# Patient Record
Sex: Male | Born: 1990 | Race: Black or African American | Hispanic: No | Marital: Single | Smoking: Never smoker
Health system: Southern US, Community
[De-identification: ages and names within clinical notes are randomized; demographics above are authoritative.]

---
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
## 2011-10-22 ENCOUNTER — Encounter (HOSPITAL_COMMUNITY): Payer: Self-pay | Admitting: *Deleted

## 2011-10-22 ENCOUNTER — Emergency Department (HOSPITAL_COMMUNITY)
Admission: EM | Admit: 2011-10-22 | Discharge: 2011-10-22 | Disposition: A | Discharge status: Home / Self Care | Payer: Managed Care, Other (non HMO) | Source: Home / Self Care | Attending: Emergency Medicine | Admitting: Emergency Medicine

## 2011-10-22 DIAGNOSIS — R079 Chest pain, unspecified: Secondary | ICD-10-CM

## 2011-10-22 DIAGNOSIS — R0602 Shortness of breath: Secondary | ICD-10-CM

## 2011-10-22 MED ORDER — ALBUTEROL SULFATE HFA 108 (90 BASE) MCG/ACT IN AERS: 1.0000 | INHALATION_SPRAY | Freq: Four times a day (QID) | RESPIRATORY_TRACT | Status: DC | PRN

## 2011-10-22 NOTE — ED Provider Notes (Addendum)
History     CSN: [id number]  Arrival date & time 10/22/11  1324   First MD Initiated Contact with Patient 10/22/11 1424      Chief Complaint  Patient presents with  . Chest Pain    (Consider location/radiation/quality/duration/timing/severity/associated sxs/prior treatment) HPI Comments: Patient describes a for several months he notices it when he performs certain physical activities or participating in athletics he gets short of breath and experiences a midsternal chest pain that feels like pressure. Patient denies any recent respiratory symptoms such as cough, nasal congestion or fevers. Denies any constitutional symptoms at this point such as fevers generalized malaise, arthralgias myalgias or changes in appetite or unintentional weight loss. Patient denies expressing any shortness of breath, chest pains or palpitations at this moment. He describes it in the past it was discovered that he had a murmur and he underwent consultation with a cardiologist a couple years ago where no abnormalities were found.  Patient is a 21 y.o. male presenting with chest pain. The history is provided by the patient.  Chest Pain The chest pain began more than 2 weeks ago. Chest pain occurs constantly. The chest pain is unchanged. At its most intense, the pain is at 5/10. The quality of the pain is described as dull. Chest pain is worsened by certain positions. Primary symptoms include shortness of breath. Pertinent negatives for primary symptoms include no fever, no cough, no wheezing, no palpitations, no abdominal pain, no nausea, no vomiting, no dizziness and no altered mental status.  Pertinent negatives for associated symptoms include no diaphoresis, no numbness and no weakness.     History reviewed. No pertinent past medical history.  History reviewed. No pertinent past surgical history.  History reviewed. No pertinent family history.  History  Substance Use Topics  . Smoking status: Not on file    . Smokeless tobacco: Not on file  . Alcohol Use: Not on file      Review of Systems  Constitutional: Negative for fever, diaphoresis and activity change.  Respiratory: Positive for shortness of breath. Negative for cough and wheezing.   Cardiovascular: Positive for chest pain. Negative for palpitations and leg swelling.  Gastrointestinal: Negative for nausea, vomiting and abdominal pain.  Neurological: Negative for dizziness, tremors, syncope, weakness, numbness and headaches.  Psychiatric/Behavioral: Negative for altered mental status.    Allergies  Review of patient's allergies indicates not on file.  Home Medications   Current Outpatient Rx  Name Route Sig Dispense Refill  . ALBUTEROL SULFATE HFA 108 (90 BASE) MCG/ACT IN AERS Inhalation Inhale 1-2 puffs into the lungs every 6 (six) hours as needed for wheezing. 1 Inhaler 0    BP 126/67  Pulse 84  Temp 98.5 F (36.9 C) (Oral)  Resp 16  SpO2 100%  Physical Exam  Nursing note and vitals reviewed. Constitutional: Vital signs are normal. He appears well-developed and well-nourished.  Non-toxic appearance. He does not have a sickly appearance. He does not appear ill. No distress.  HENT:  Head: Normocephalic.  Eyes: Conjunctivae normal are normal. No scleral icterus.  Neck: Neck supple. No JVD present. No tracheal deviation present. No thyromegaly present.  Cardiovascular: Regular rhythm, normal heart sounds and normal pulses.  Exam reveals no gallop and no friction rub.   No murmur heard. Pulmonary/Chest: Effort normal and breath sounds normal. No respiratory distress. He has no decreased breath sounds. He has no wheezes. He has no rhonchi. He has no rales. He exhibits no tenderness.  Abdominal: Soft. There is  no tenderness. There is no rebound and no guarding.  Neurological: He is alert.  Skin: Skin is warm.    ED Course  Procedures (including critical care time)  Labs Reviewed - No data to display No results  found.   1. Shortness of breath   2. Chest pain    rhythm strip performed with handheld device for 60 seconds. Reveal no cardiac arrhythmias. Normal sinus rhythm with a ventricular rate of 56-60 per minute. Informational bedside echo done were a normal long axis parasternal view was performed with no obvious abnormalities such as mitral valve dysfunction or ventricular wall hypokinesis.   MDM   Exertional dyspnea with chest pains. Patient was asymptomatic during exam had a normal lung cardiovascular exam. Historically the patient has been seen by a cardiologist in the past where no abnormalities were found as patient reports. Patient was encouraged to try albuterol, if shortness of breath with exertional activities as a trial. Patient agrees treatment plan and follow-up care further chest pains associated with dizziness, shortness of breath he should go to the emergency department for further evaluationas necessary.     Jimmie Molly, MD 10/22/11 1610  Jimmie Molly, MD 10/22/11 [phone]

## 2011-10-22 NOTE — ED Notes (Signed)
Pt  Reports     midsternal  Chest  Pain   Worse  When  He  Takes  A  Deep  Breath         He  Also  Reports     He  Gets  The  Symptoms    After  Participating  In  Athletics        At this  Time  He  Is  Pain  Free   siting on  Exam  Table   Speaking in  Complete  sentances  Skin  Is       Warm  And  Dry

## 2012-04-19 ENCOUNTER — Emergency Department (HOSPITAL_COMMUNITY)
Admission: EM | Admit: 2012-04-19 | Discharge: 2012-04-19 | Disposition: A | Discharge status: Home / Self Care | Payer: 59 | Attending: Emergency Medicine | Admitting: Emergency Medicine

## 2012-04-19 ENCOUNTER — Emergency Department (HOSPITAL_COMMUNITY): Payer: 59

## 2012-04-19 DIAGNOSIS — R0789 Other chest pain: Secondary | ICD-10-CM | POA: Insufficient documentation

## 2012-04-19 LAB — CBC
Hemoglobin: 14.4 g/dL (ref 13.0–17.0)
MCH: 30.4 pg (ref 26.0–34.0)
MCHC: 36.6 g/dL — ABNORMAL HIGH (ref 30.0–36.0)
Platelets: 226 10*3/uL (ref 150–400)
RBC: 4.74 MIL/uL (ref 4.22–5.81)

## 2012-04-19 LAB — BASIC METABOLIC PANEL
Calcium: 9.9 mg/dL (ref 8.4–10.5)
GFR calc non Af Amer: >90 mL/min (ref >90)
Potassium: 3.8 mEq/L (ref 3.5–5.1)
Sodium: 142 mEq/L (ref 135–145)

## 2012-04-19 MED ORDER — IBUPROFEN 200 MG PO TABS
400.0000 mg | ORAL_TABLET | Freq: Once | ORAL | Status: AC
  Administered 2012-04-19: 400 mg via ORAL
  Filled 2012-04-19: qty 2

## 2012-04-19 NOTE — ED Provider Notes (Signed)
Complains of constant chest pain for 2 days, pleuritic worse with lying supine and improved with standing up no fever and no shortness of breath no other complaints treated himself with albuterol, without relief. On exam patient is in no distress lungs clear auscultation heart regular rate and rhythm no murmurs or rubs pain is improved since treatment with ibuprofen in the emergency department is presently mild. Plan ibuprofen cardiology followup as needed if not improved in a few days. There is no EKG evidence of pericarditis  Doug Sou, MD 04/19/12 [phone]

## 2012-04-19 NOTE — ED Notes (Signed)
Pt states non radiating substernal Chest pressure x 2 days. Denies N/V, diaphoresis, SOB . Pain exacerbated by laying down, Pain relived by standing up. Activity at onset pt was sitting.

## 2012-04-19 NOTE — ED Provider Notes (Signed)
History     CSN: [id number]  Arrival date & time 04/19/12  6213   First MD Initiated Contact with Patient 04/19/12 [phone]      Chief Complaint  Patient presents with  . Chest Pain    (Consider location/radiation/quality/duration/timing/severity/associated sxs/prior treatment) HPI  Edward Wilcox is a 22 y.o. male complaining of substernal chest pressure intermittently for 2 days pain at its worst is 7/10 it is 7/10 right now. Standing alleviates the symptoms, lying down supine exacerbates. Patient took an albuterol inhalation to ease the pain with no relief. No family history of early cardiac death. Patient denies fever, chest pain, abdominal pain, shortness of breath, back pain, recent immobilization, leg swelling, viral syndrome in the last 2 months, unusual exertion, exercise for trauma.    No past medical history on file.  No past surgical history on file.  No family history on file.  History  Substance Use Topics  . Smoking status: Not on file  . Smokeless tobacco: Not on file  . Alcohol Use: Not on file      Review of Systems  Constitutional: Negative for fever.  Respiratory: Negative for cough and shortness of breath.   Cardiovascular: Positive for chest pain.  Gastrointestinal: Negative for nausea, vomiting, abdominal pain and diarrhea.  All other systems reviewed and are negative.    Allergies  Review of patient's allergies indicates no known allergies.  Home Medications   Current Outpatient Rx  Name  Route  Sig  Dispense  Refill  . albuterol (PROVENTIL HFA;VENTOLIN HFA) 108 (90 BASE) MCG/ACT inhaler   Inhalation   Inhale 1-2 puffs into the lungs every 6 (six) hours as needed for wheezing.   1 Inhaler   0     BP 141/76  Pulse 68  Temp(Src) 98 F (36.7 C)  Resp 21  SpO2 98%  Physical Exam  Nursing note and vitals reviewed. Constitutional: He is oriented to person, place, and time. He appears well-developed and well-nourished. No distress.   HENT:  Head: Normocephalic.  Mouth/Throat: Oropharynx is clear and moist.  Eyes: Conjunctivae and EOM are normal. Pupils are equal, round, and reactive to light.  Neck: Normal range of motion. No JVD present.  Cardiovascular: Normal rate, regular rhythm and intact distal pulses.   Pulmonary/Chest: Effort normal and breath sounds normal. No stridor. No respiratory distress. He has no wheezes. He has no rales. He exhibits no tenderness.  nonreproducible chest pain  Abdominal: Soft. Bowel sounds are normal. He exhibits no distension and no mass. There is no tenderness. There is no rebound and no guarding.  Musculoskeletal: Normal range of motion. He exhibits no edema.  No calf asymmetry, edema, erythema, superficial collaterals, palpable cords, Homans negative bilaterally.  Neurological: He is alert and oriented to person, place, and time.  Psychiatric: He has a normal mood and affect.    ED Course  Procedures (including critical care time)  Labs Reviewed  CBC - Abnormal; Notable for the following:    MCHC 36.6 (*)    All other components within normal limits  BASIC METABOLIC PANEL   Dg Chest Port 1 View  04/19/2012  *RADIOLOGY REPORT*  Clinical Data: Chest pain  PORTABLE CHEST - 1 VIEW  Comparison: None.  Findings: Borderline heart size with normal pulmonary vascularity. No focal consolidation or airspace disease in the lungs.  No blunting of costophrenic angles.  No pneumothorax.  Mediastinal contours appear intact.  Old healed fracture deformity of the right clavicle.  IMPRESSION: No evidence of  active pulmonary disease.   Original Report Authenticated By: Burman Nieves, M.D.      Date: 04/19/2012  Rate: 65 Rhythm: normal sinus rhythm  QRS Axis: normal  Intervals: PR prolonged  ST/T Wave abnormalities: normal  Conduction Disutrbances:first-degree A-V block   Narrative Interpretation:   Old EKG Reviewed: none available    1. Atypical chest pain       MDM   [name] is a 22 y.o. male With 2 days of intermittent non-reproducible pressure-like chest pain that is alleviated by standing up. EKG is nonischemic, no signs of pericarditis, QT prolongation.   She is improved after Motrin.  Filed Vitals:   04/19/12 0645 04/19/12 0700 04/19/12 0715 04/19/12 0730  BP: 138/81 133/83 129/84 130/80  Pulse: 68 62 61 57  Temp:      Resp: 19 17 17 12   SpO2: 98% 100% 100% 100%    This is a shared visit with the attending physician who personally evaluated the patient and agrees with the care plan.   Pt verbalized understanding and agrees with care plan. Outpatient follow-up and return precautions given.             Wynetta Emery, PA-C 04/19/12 [phone]

## 2012-04-19 NOTE — ED Provider Notes (Signed)
Medical screening examination/treatment/procedure(s) were conducted as a shared visit with non-physician practitioner(s) and myself.  I personally evaluated the patient during the encounter  Doug Sou, MD 04/19/12 1130

## 2014-03-01 ENCOUNTER — Encounter (HOSPITAL_COMMUNITY): Payer: Self-pay | Admitting: Family Medicine

## 2014-03-01 ENCOUNTER — Emergency Department (INDEPENDENT_AMBULATORY_CARE_PROVIDER_SITE_OTHER): Payer: Managed Care, Other (non HMO)

## 2014-03-01 ENCOUNTER — Emergency Department (INDEPENDENT_AMBULATORY_CARE_PROVIDER_SITE_OTHER)
Admission: EM | Admit: 2014-03-01 | Discharge: 2014-03-01 | Disposition: A | Discharge status: Home / Self Care | Payer: Managed Care, Other (non HMO) | Source: Home / Self Care | Attending: Family Medicine | Admitting: Family Medicine

## 2014-03-01 DIAGNOSIS — S93402A Sprain of unspecified ligament of left ankle, initial encounter: Secondary | ICD-10-CM

## 2014-03-01 DIAGNOSIS — IMO0001 Reserved for inherently not codable concepts without codable children: Secondary | ICD-10-CM

## 2014-03-01 NOTE — ED Notes (Signed)
States he was playing basketball yesterday, and landed on another players foot . Pain and swelling left lateral ankle

## 2014-03-01 NOTE — ED Provider Notes (Addendum)
CSN: [id number]     Arrival date & time 03/01/14  1120 History   None    Chief Complaint  Patient presents with  . Ankle Injury   (Consider location/radiation/quality/duration/timing/severity/associated sxs/prior Treatment) HPI  L ankle: playing basketball yesterday anf pts foot came down on someone elses foot. Rolled foot under. Felt a pop. Stopped playing. Immediately painful. Immediately started to swell. Elevation and ice w/ some benefit. Sensation and movement intact. Denies syncope, loc, dizziness.    History reviewed. No pertinent past medical history. History reviewed. No pertinent past surgical history. Family History  Problem Relation Age of Onset  . Cancer Neg Hx   . Heart failure Neg Hx   . Hyperlipidemia Neg Hx   . Diabetes Neg Hx   . Hypertension Neg Hx    History  Substance Use Topics  . Smoking status: Never Smoker   . Smokeless tobacco: Not on file  . Alcohol Use: No    Review of Systems Per HPI with all other pertinent systems negative.   Allergies  Review of patient's allergies indicates no known allergies.  Home Medications   Prior to Admission medications   Not on File   BP 126/79 mmHg  Pulse 69  Temp(Src) 99.5 F (37.5 C) (Oral)  Resp 16  SpO2 100% Physical Exam  Constitutional: He is oriented to person, place, and time. He appears well-developed and well-nourished.  HENT:  Head: Normocephalic and atraumatic.  Eyes: EOM are normal. Pupils are equal, round, and reactive to light.  Neck: Normal range of motion.  Cardiovascular: Normal rate, normal heart sounds and intact distal pulses.   Pulmonary/Chest: Effort normal and breath sounds normal.  Abdominal: He exhibits no distension.  Musculoskeletal:  Left ankle full range of motion. Tenderness to palpation near the insertion of the ATFL. Significant swelling of the ankle laterally. No ankle instability  Neurological: He is alert and oriented to person, place, and time.  Skin: Skin is  warm.  Psychiatric: He has a normal mood and affect. His behavior is normal. Thought content normal.  ED Course  Procedures (including critical care time) Labs Review Labs Reviewed - No data to display  Imaging Review No results found.   MDM   1. Grade 2 ankle sprain, left, initial encounter     Grade 2 left ankle sprain. Rest ice, anti-inflammatories Tried ASO but pt preferrs Cam walker and crutches for greater support.  Encouraged early ambulation and ROM exercises Patient to let pain be the guide. Patient to return if no improvement in 2 weeks for further imaging.  Precautions given and all questions answered  Shelly Flattenavid Ademide Schaberg, MD Family Medicine 03/01/2014, 12:41 PM     Ozella Rocksavid J Bhavika Schnider, MD 03/01/14 1241  Ozella Rocksavid J Aurie Harroun, MD 03/01/14 1256

## 2014-03-01 NOTE — Discharge Instructions (Signed)
You have developed a grade 2 ankle sprain. There is no evidence of fracture. Please use the ankle brace as needed for support. His let pain be her guide return to activity as able. Please elevate and ice her foot for the next 24-48 hours. Please use ibuprofen 600 mg every 6 hours for pain and inflammation. Please contact if your pain is not any better in 2 weeks for further x-rays.   Acute Ankle Sprain with Phase I Rehab An acute ankle sprain is a partial or complete tear in one or more of the ligaments of the ankle due to traumatic injury. The severity of the injury depends on both the number of ligaments sprained and the grade of sprain. There are 3 grades of sprains.   A grade 1 sprain is a mild sprain. There is a slight pull without obvious tearing. There is no loss of strength, and the muscle and ligament are the correct length.  A grade 2 sprain is a moderate sprain. There is tearing of fibers within the substance of the ligament where it connects two bones or two cartilages. The length of the ligament is increased, and there is usually decreased strength.  A grade 3 sprain is a complete rupture of the ligament and is uncommon. In addition to the grade of sprain, there are three types of ankle sprains.  Lateral ankle sprains: This is a sprain of one or more of the three ligaments on the outer side (lateral) of the ankle. These are the most common sprains. Medial ankle sprains: There is one large triangular ligament of the inner side (medial) of the ankle that is susceptible to injury. Medial ankle sprains are less common. Syndesmosis, "high ankle," sprains: The syndesmosis is the ligament that connects the two bones of the lower leg. Syndesmosis sprains usually only occur with very severe ankle sprains. SYMPTOMS  Pain, tenderness, and swelling in the ankle, starting at the side of injury that may progress to the whole ankle and foot with time.  "Pop" or tearing sensation at the time of  injury.  Bruising that may spread to the heel.  Impaired ability to walk soon after injury. CAUSES   Acute ankle sprains are caused by trauma placed on the ankle that temporarily forces or pries the anklebone (talus) out of its normal socket.  Stretching or tearing of the ligaments that normally hold the joint in place (usually due to a twisting injury). RISK INCREASES WITH:  Previous ankle sprain.  Sports in which the foot may land awkwardly (i.e., basketball, volleyball, or soccer) or walking or running on uneven or rough surfaces.  Shoes with inadequate support to prevent sideways motion when stress occurs.  Poor strength and flexibility.  Poor balance skills.  Contact sports. PREVENTION   Warm up and stretch properly before activity.  Maintain physical fitness:  Ankle and leg flexibility, muscle strength, and endurance.  Cardiovascular fitness.  Balance training activities.  Use proper technique and have a coach correct improper technique.  Taping, protective strapping, bracing, or high-top tennis shoes may help prevent injury. Initially, tape is best; however, it loses most of its support function within 10 to 15 minutes.  Wear proper-fitted protective shoes (High-top shoes with taping or bracing is more effective than either alone).  Provide the ankle with support during sports and practice activities for 12 months following injury. PROGNOSIS   If treated properly, ankle sprains can be expected to recover completely; however, the length of recovery depends on the degree of  injury.  A grade 1 sprain usually heals enough in 5 to 7 days to allow modified activity and requires an average of 6 weeks to heal completely.  A grade 2 sprain requires 6 to 10 weeks to heal completely.  A grade 3 sprain requires 12 to 16 weeks to heal.  A syndesmosis sprain often takes more than 3 months to heal. RELATED COMPLICATIONS   Frequent recurrence of symptoms may result in a  chronic problem. Appropriately addressing the problem the first time decreases the frequency of recurrence and optimizes healing time. Severity of the initial sprain does not predict the likelihood of later instability.  Injury to other structures (bone, cartilage, or tendon).  A chronically unstable or arthritic ankle joint is a possibility with repeated sprains. TREATMENT Treatment initially involves the use of ice, medication, and compression bandages to help reduce pain and inflammation. Ankle sprains are usually immobilized in a walking cast or boot to allow for healing. Crutches may be recommended to reduce pressure on the injury. After immobilization, strengthening and stretching exercises may be necessary to regain strength and a full range of motion. Surgery is rarely needed to treat ankle sprains. MEDICATION   Nonsteroidal anti-inflammatory medications, such as aspirin and ibuprofen (do not take for the first 3 days after injury or within 7 days before surgery), or other minor pain relievers, such as acetaminophen, are often recommended. Take these as directed by your caregiver. Contact your caregiver immediately if any bleeding, stomach upset, or signs of an allergic reaction occur from these medications.  Ointments applied to the skin may be helpful.  Pain relievers may be prescribed as necessary by your caregiver. Do not take prescription pain medication for longer than 4 to 7 days. Use only as directed and only as much as you need. HEAT AND COLD  Cold treatment (icing) is used to relieve pain and reduce inflammation for acute and chronic cases. Cold should be applied for 10 to 15 minutes every 2 to 3 hours for inflammation and pain and immediately after any activity that aggravates your symptoms. Use ice packs or an ice massage.  Heat treatment may be used before performing stretching and strengthening activities prescribed by your caregiver. Use a heat pack or a warm soak. SEEK  IMMEDIATE MEDICAL CARE IF:   Pain, swelling, or bruising worsens despite treatment.  You experience pain, numbness, discoloration, or coldness in the foot or toes.  New, unexplained symptoms develop (drugs used in treatment may produce side effects.) EXERCISES  PHASE I EXERCISES RANGE OF MOTION (ROM) AND STRETCHING EXERCISES - Ankle Sprain, Acute Phase I, Weeks 1 to 2 These exercises may help you when beginning to restore flexibility in your ankle. You will likely work on these exercises for the 1 to 2 weeks after your injury. Once your physician, physical therapist, or athletic trainer sees adequate progress, he or she will advance your exercises. While completing these exercises, remember:   Restoring tissue flexibility helps normal motion to return to the joints. This allows healthier, less painful movement and activity.  An effective stretch should be held for at least 30 seconds.  A stretch should never be painful. You should only feel a gentle lengthening or release in the stretched tissue. RANGE OF MOTION - Dorsi/Plantar Flexion  While sitting with your right / left knee straight, draw the top of your foot upwards by flexing your ankle. Then reverse the motion, pointing your toes downward.  Hold each position for __________ seconds.  After completing  your first set of exercises, repeat this exercise with your knee bent. Repeat __________ times. Complete this exercise __________ times per day.  RANGE OF MOTION - Ankle Alphabet  Imagine your right / left big toe is a pen.  Keeping your hip and knee still, write out the entire alphabet with your "pen." Make the letters as large as you can without increasing any discomfort. Repeat __________ times. Complete this exercise __________ times per day.  STRENGTHENING EXERCISES - Ankle Sprain, Acute -Phase I, Weeks 1 to 2 These exercises may help you when beginning to restore strength in your ankle. You will likely work on these exercises  for 1 to 2 weeks after your injury. Once your physician, physical therapist, or athletic trainer sees adequate progress, he or she will advance your exercises. While completing these exercises, remember:   Muscles can gain both the endurance and the strength needed for everyday activities through controlled exercises.  Complete these exercises as instructed by your physician, physical therapist, or athletic trainer. Progress the resistance and repetitions only as guided.  You may experience muscle soreness or fatigue, but the pain or discomfort you are trying to eliminate should never worsen during these exercises. If this pain does worsen, stop and make certain you are following the directions exactly. If the pain is still present after adjustments, discontinue the exercise until you can discuss the trouble with your clinician. STRENGTH - Dorsiflexors  Secure a rubber exercise band/tubing to a fixed object (i.e., table, pole) and loop the other end around your right / left foot.  Sit on the floor facing the fixed object. The band/tubing should be slightly tense when your foot is relaxed.  Slowly draw your foot back toward you using your ankle and toes.  Hold this position for __________ seconds. Slowly release the tension in the band and return your foot to the starting position. Repeat __________ times. Complete this exercise __________ times per day.  STRENGTH - Plantar-flexors   Sit with your right / left leg extended. Holding onto both ends of a rubber exercise band/tubing, loop it around the ball of your foot. Keep a slight tension in the band.  Slowly push your toes away from you, pointing them downward.  Hold this position for __________ seconds. Return slowly, controlling the tension in the band/tubing. Repeat __________ times. Complete this exercise __________ times per day.  STRENGTH - Ankle Eversion  Secure one end of a rubber exercise band/tubing to a fixed object (table,  pole). Loop the other end around your foot just before your toes.  Place your fists between your knees. This will focus your strengthening at your ankle.  Drawing the band/tubing across your opposite foot, slowly, pull your little toe out and up. Make sure the band/tubing is positioned to resist the entire motion.  Hold this position for __________ seconds. Have your muscles resist the band/tubing as it slowly pulls your foot back to the starting position.  Repeat __________ times. Complete this exercise __________ times per day.  STRENGTH - Ankle Inversion  Secure one end of a rubber exercise band/tubing to a fixed object (table, pole). Loop the other end around your foot just before your toes.  Place your fists between your knees. This will focus your strengthening at your ankle.  Slowly, pull your big toe up and in, making sure the band/tubing is positioned to resist the entire motion.  Hold this position for __________ seconds.  Have your muscles resist the band/tubing as it slowly pulls  your foot back to the starting position. Repeat __________ times. Complete this exercises __________ times per day.  STRENGTH - Towel Curls  Sit in a chair positioned on a non-carpeted surface.  Place your right / left foot on a towel, keeping your heel on the floor.  Pull the towel toward your heel by only curling your toes. Keep your heel on the floor.  If instructed by your physician, physical therapist, or athletic trainer, add weight to the end of the towel. Repeat __________ times. Complete this exercise __________ times per day. Document Released: 08/07/2004 Document Revised: 05/23/2013 Document Reviewed: 04/20/2008 [facility] Patient Information 2015 [place], [place]. This information is not intended to replace advice given to you by your health care provider. Make sure you discuss any questions you have with your health care provider.

## 2014-03-06 ENCOUNTER — Telehealth (HOSPITAL_COMMUNITY): Payer: Self-pay | Admitting: *Deleted

## 2014-03-11 ENCOUNTER — Telehealth (HOSPITAL_COMMUNITY): Payer: Self-pay | Admitting: *Deleted

## 2014-03-11 NOTE — ED Notes (Signed)
Spoke with Dr. Konrad DoloresMerrell.  Phone call was to notify pt that final radiology report stated small avulsion fx.  Per Dr. Konrad DoloresMerrell: pt just needs to f/u with orthopedist of pt's choice in next 1-2 wks; cam walker pt received is sufficient.  Pt notified of info - verbalized understanding.

## 2014-03-14 NOTE — ED Notes (Signed)
I was calling pt. back and left him a message,  but I see a note now that he called back on 2/20 and was given the information. Edward Wilcox, Edward Wilcox 03/14/2014

## 2016-08-15 IMAGING — DX DG ANKLE COMPLETE 3+V*L*
3 series · 3 of 3 positions shown · non-contrast
Comparison: None.

CLINICAL DATA: Fall yesterday with lateral malleolus pain on the
left. Initial encounter

EXAM:
LEFT ANKLE COMPLETE - 3+ VIEW

[ankle ap]
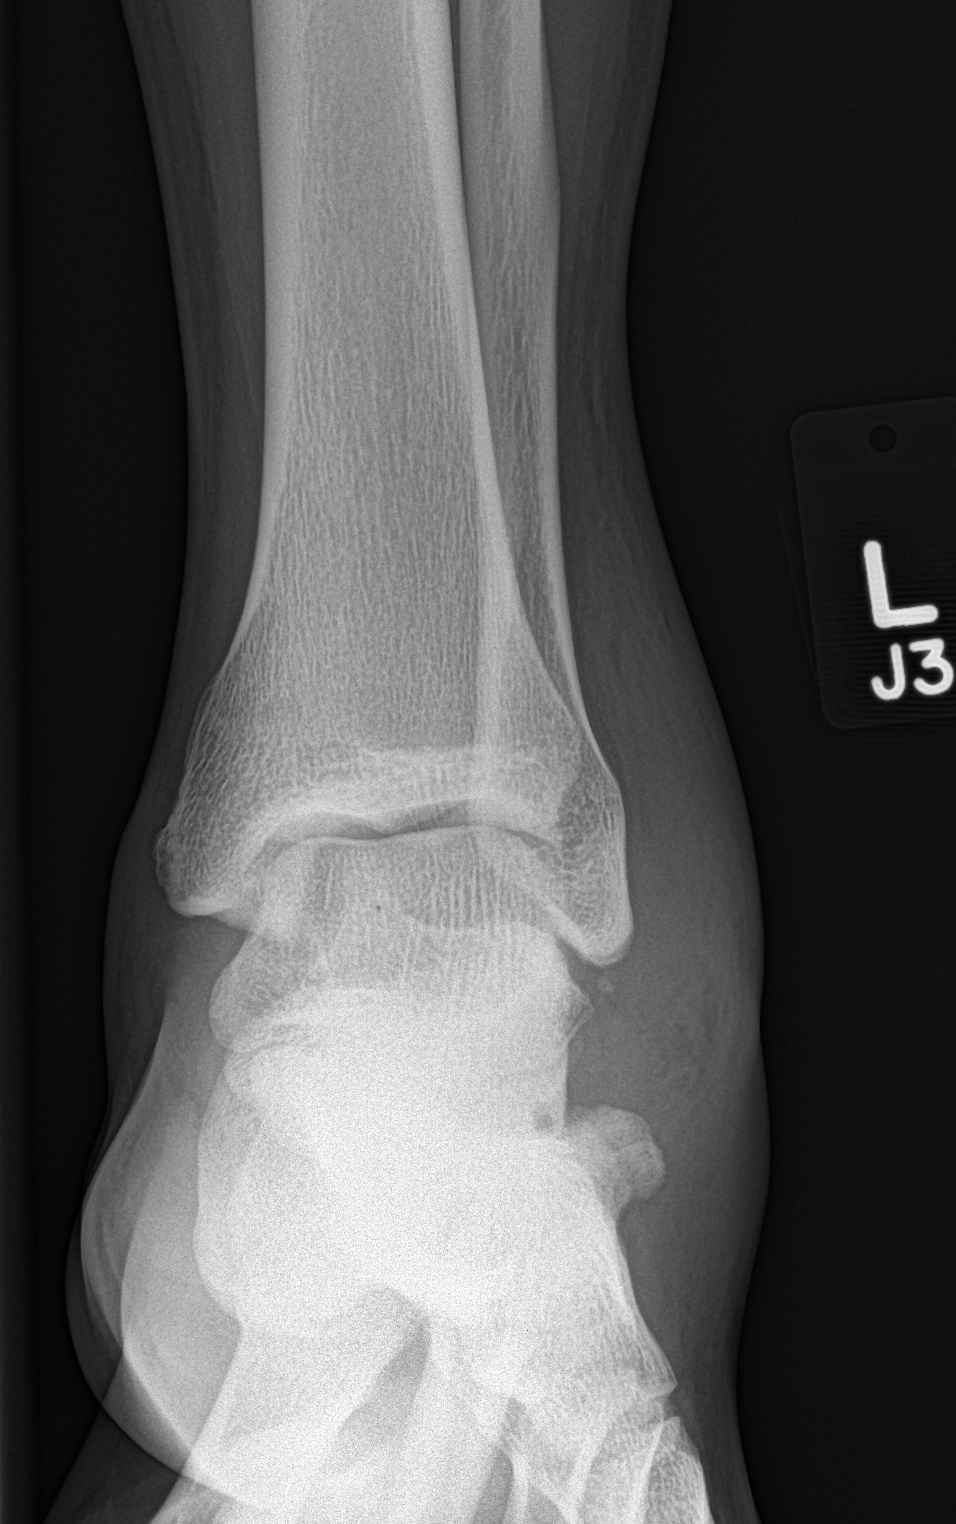

[ankle obl]
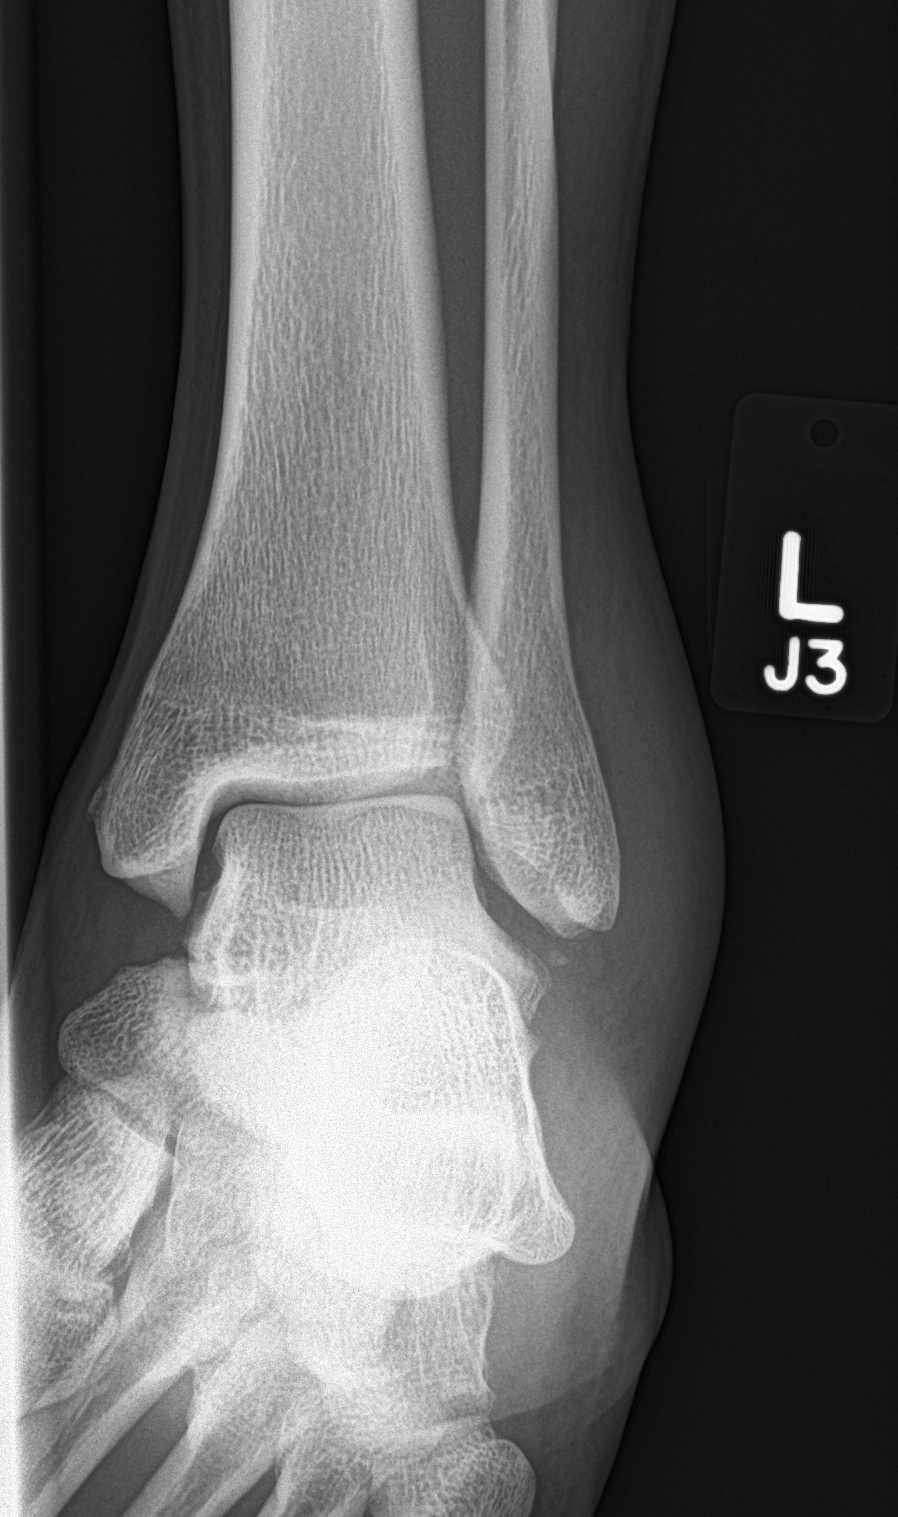

[ankle lat]
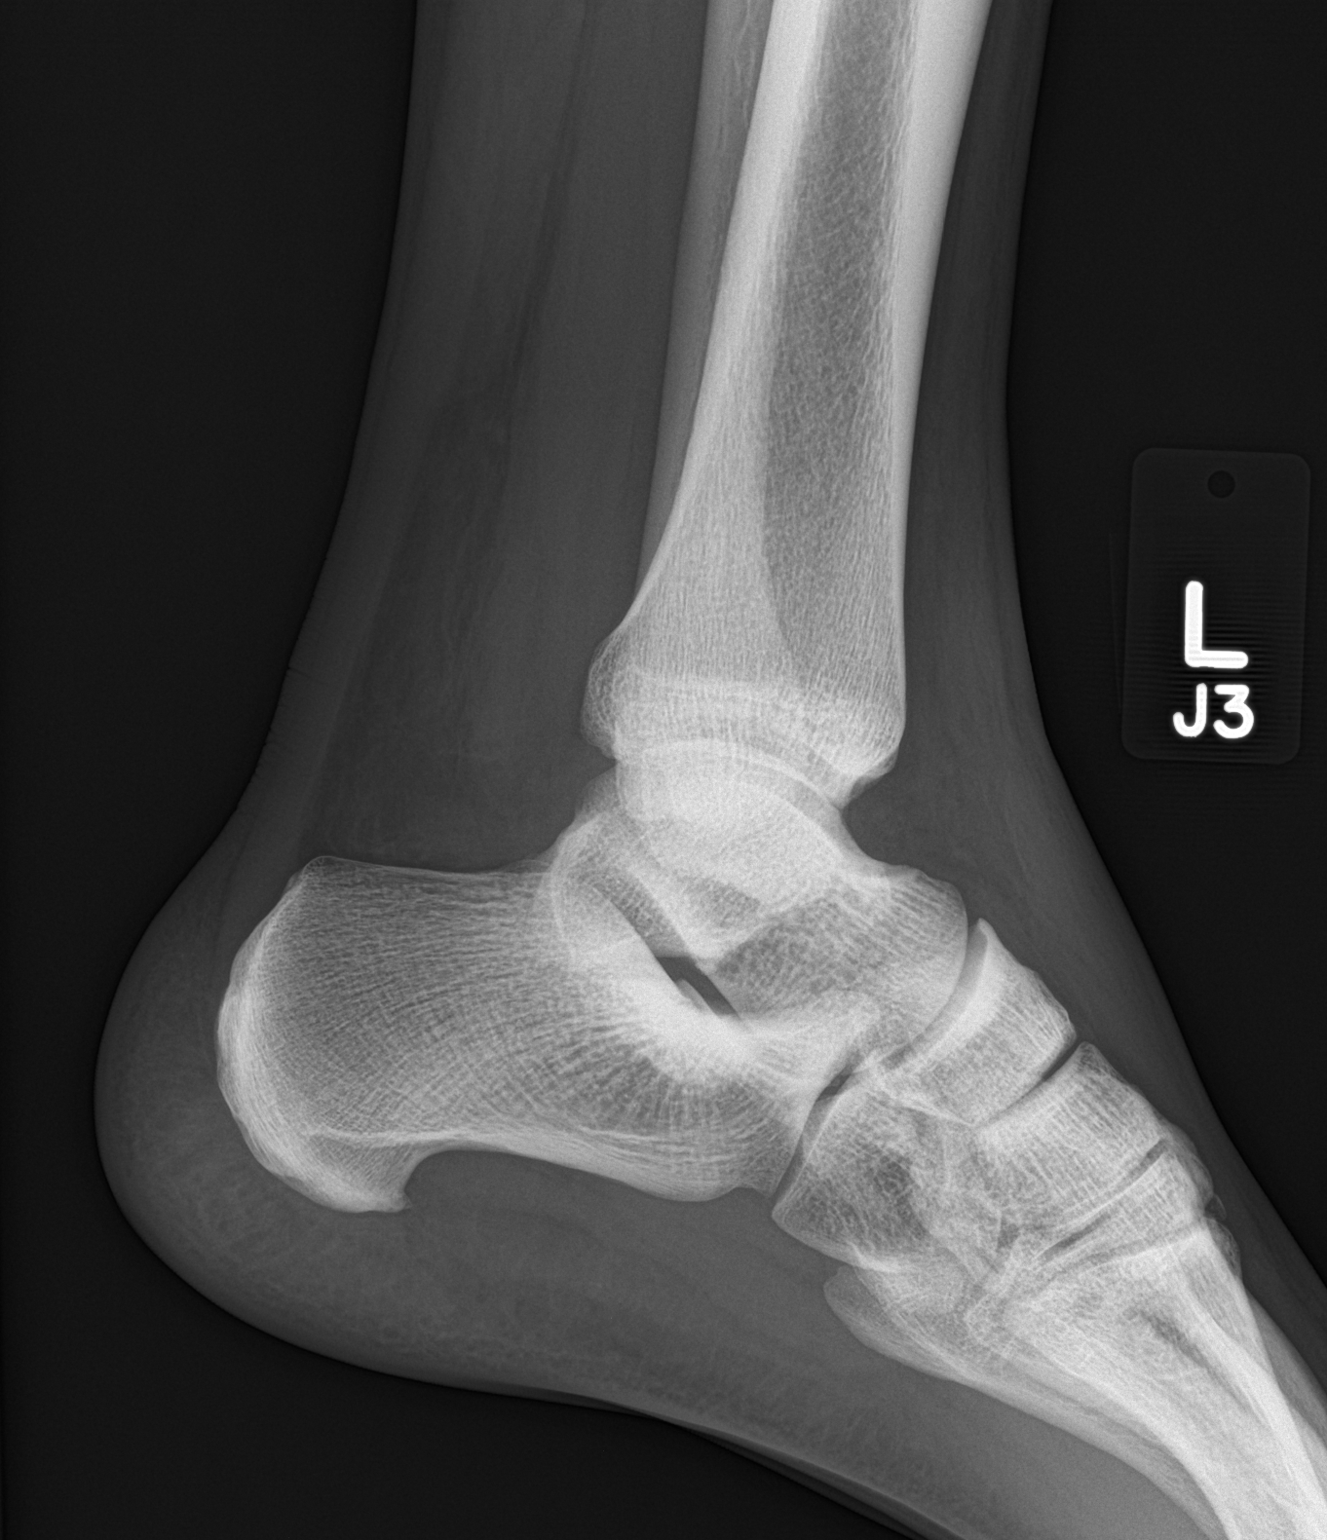

[3 of 3 positions shown; findings below may reference images not displayed]

FINDINGS: Marked soft tissue swelling about the lateral ankle. 2 small bone
fragments below the lateral malleolus appear corticated and chronic.
No acute fracture or ankle mortise widening identified.
IMPRESSION: 1. Remote appearing avulsion fracture of the lateral malleolus. No
acute fracture identified.
2. Marked periarticular soft tissue swelling.
